# Patient Record
Sex: Female | Born: 2000 | Race: Black or African American | Hispanic: No | Marital: Single | Smoking: Never smoker
Health system: Southern US, Community
[De-identification: ages and names within clinical notes are randomized; demographics above are authoritative.]

## PROBLEM LIST (undated history)

## (undated) DIAGNOSIS — D649 Anemia, unspecified: Secondary | ICD-10-CM

## (undated) DIAGNOSIS — J302 Other seasonal allergic rhinitis: Secondary | ICD-10-CM

## (undated) HISTORY — PX: UMBILICAL HERNIA REPAIR: SHX196

## (undated) HISTORY — DX: Anemia, unspecified: D64.9

---
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
## 2022-04-01 ENCOUNTER — Ambulatory Visit: Payer: Medicaid Other | Attending: Nurse Practitioner | Admitting: Nurse Practitioner

## 2022-04-01 ENCOUNTER — Encounter: Payer: Self-pay | Admitting: Nurse Practitioner

## 2022-04-01 VITALS — BP 132/82 | HR 79 | Temp 98.0°F | Ht 67.8 in | Wt 167.0 lb

## 2022-04-01 DIAGNOSIS — N6311 Unspecified lump in the right breast, upper outer quadrant: Secondary | ICD-10-CM | POA: Diagnosis not present

## 2022-04-01 NOTE — Progress Notes (Signed)
Assessment & Plan:  Gabrielle King was seen today for breast problem.  Diagnoses and all orders for this visit:  Breast lump on right side at 11 o'clock position Follow up in 2-3 weeks after menstrual cycle for repeat exam    Patient has been counseled on age-appropriate routine health concerns for screening and prevention. These are reviewed and up-to-date. Referrals have been placed accordingly. Immunizations are up-to-date or declined.    Subjective:   Chief Complaint  Patient presents with   Breast Problem    Right breast lump    HPI Gabrielle King 21 y.o. female presents to office today right breast lump which she noticed today. Her menstrual cycle is due at the end of the week. She was very concerned as she has a history of breast cancer in maternal grandmother and maternal great aunt.    Review of Systems  Constitutional:  Negative for fever, malaise/fatigue and weight loss.  HENT: Negative.  Negative for nosebleeds.   Eyes: Negative.  Negative for blurred vision, double vision and photophobia.  Respiratory: Negative.  Negative for cough and shortness of breath.   Cardiovascular: Negative.  Negative for chest pain, palpitations and leg swelling.  Gastrointestinal: Negative.  Negative for heartburn, nausea and vomiting.  Musculoskeletal: Negative.  Negative for myalgias.  Skin:        SEE HPI  Neurological: Negative.  Negative for dizziness, focal weakness, seizures and headaches.  Psychiatric/Behavioral: Negative.  Negative for suicidal ideas.     Past Medical History:  Diagnosis Date   Anemia    Seasonal allergies     Past Surgical History:  Procedure Laterality Date   UMBILICAL HERNIA REPAIR      Family History  Problem Relation Age of Onset   Hypertension Mother    Hypothyroidism Mother     Social History Reviewed with no changes to be made today.   Outpatient Medications Prior to Visit  Medication Sig Dispense Refill   ferrous sulfate 325 (65 FE) MG  EC tablet Take 1 tablet (325 mg total) by mouth daily with breakfast. (Patient taking differently: Take 100 mg by mouth daily with breakfast.) 90 tablet 1   Olopatadine HCl 0.2 % SOLN Apply 1 drop to eye daily as needed. 2.5 mL 3   No facility-administered medications prior to visit.    Allergies  Allergen Reactions   Shellfish Allergy Itching       Objective:    BP 132/82   Pulse 79   Temp 98 F (36.7 C) (Temporal)   Ht 5' 7.8" (1.722 m)   Wt 167 lb (75.8 kg)   LMP 03/10/2022 (Approximate)   SpO2 98%   BMI 25.54 kg/m  Wt Readings from Last 3 Encounters:  04/01/22 167 lb (75.8 kg)  12/01/19 153 lb (69.4 kg) (84 %, Z= 1.00)*  07/26/16 156 lb 11.2 oz (71.1 kg) (91 %, Z= 1.35)*   * Growth percentiles are based on CDC (Girls, 2-20 Years) data.    Physical Exam Vitals and nursing note reviewed.  Constitutional:      Appearance: She is well-developed.  HENT:     Head: Normocephalic and atraumatic.  Cardiovascular:     Rate and Rhythm: Normal rate and regular rhythm.     Heart sounds: Normal heart sounds. No murmur heard.    No friction rub. No gallop.  Pulmonary:     Effort: Pulmonary effort is normal. No tachypnea or respiratory distress.     Breath sounds: Normal breath sounds. No decreased  breath sounds, wheezing, rhonchi or rales.  Chest:     Chest wall: No tenderness.     Comments: She has several areas on both breasts with numerous cysts and lumps that are non tender Abdominal:     General: Bowel sounds are normal.     Palpations: Abdomen is soft.  Musculoskeletal:        General: Normal range of motion.     Cervical back: Normal range of motion.  Skin:    General: Skin is warm and dry.  Neurological:     Mental Status: She is alert and oriented to person, place, and time.     Coordination: Coordination normal.  Psychiatric:        Behavior: Behavior normal. Behavior is cooperative.        Thought Content: Thought content normal.        Judgment: Judgment  normal.          Patient has been counseled extensively about nutrition and exercise as well as the importance of adherence with medications and regular follow-up. The patient was given clear instructions to go to ER or return to medical center if symptoms don't improve, worsen or new problems develop. The patient verbalized understanding.   Follow-up: Return in 23 days (on 04/24/2022) for double book 1110.   Claiborne Rigg, FNP-BC [facility] and [facility] [place], [place] [phone]   04/01/2022, 3:04 PM

## 2022-04-24 ENCOUNTER — Ambulatory Visit: Payer: Medicaid Other | Attending: Nurse Practitioner | Admitting: Nurse Practitioner

## 2022-04-24 ENCOUNTER — Encounter: Payer: Self-pay | Admitting: Nurse Practitioner

## 2022-04-24 ENCOUNTER — Other Ambulatory Visit: Payer: Self-pay | Admitting: Nurse Practitioner

## 2022-04-24 VITALS — BP 126/82 | HR 97 | Temp 98.0°F | Ht 67.8 in | Wt 150.0 lb

## 2022-04-24 DIAGNOSIS — N6459 Other signs and symptoms in breast: Secondary | ICD-10-CM

## 2022-04-24 NOTE — Progress Notes (Signed)
Assessment & Plan:  Kristyne was seen today for breast mass.  Diagnoses and all orders for this visit:  Abnormal breast exam -     US BREAST LTD UNI RIGHT INC AXILLA; Future -     US BREAST LTD UNI LEFT INC AXILLA; Future    Patient has been counseled on age-appropriate routine health concerns for screening and prevention. These are reviewed and up-to-date. Referrals have been placed accordingly. Immunizations are up-to-date or declined.    Subjective:   Chief Complaint  Patient presents with   Breast Mass   HPI Gabrielle King 21 y.o. female presents to office today for repeat breast exam. She was seen in my office a few weeks ago for breast lump in the right breast however she was also very close to having her menstrual cycle so I had her return a few weeks later to reexamine her breasts. She does have a family history of breast ca in maternal grandmother and maternal great aunt.    Physical exam today is reveals likely fibrocystic breasts however there are 2 larger breast lumps in the left and right breast that will require correlation via imaging.    Review of Systems  Constitutional:  Negative for fever, malaise/fatigue and weight loss.  HENT: Negative.  Negative for nosebleeds.   Eyes: Negative.  Negative for blurred vision, double vision and photophobia.  Respiratory: Negative.  Negative for cough and shortness of breath.   Cardiovascular: Negative.  Negative for chest pain, palpitations and leg swelling.  Gastrointestinal: Negative.  Negative for heartburn, nausea and vomiting.  Musculoskeletal:  Negative for myalgias.       SEE HPI  Neurological: Negative.  Negative for dizziness, focal weakness, seizures and headaches.  Psychiatric/Behavioral: Negative.  Negative for suicidal ideas.     Past Medical History:  Diagnosis Date   Anemia    Seasonal allergies     Past Surgical History:  Procedure Laterality Date   UMBILICAL HERNIA REPAIR      Family History   Problem Relation Age of Onset   Hypertension Mother    Hypothyroidism Mother     Social History Reviewed with no changes to be made today.   Outpatient Medications Prior to Visit  Medication Sig Dispense Refill   ferrous sulfate 325 (65 FE) MG EC tablet Take 1 tablet (325 mg total) by mouth daily with breakfast. (Patient not taking: Reported on 04/24/2022) 90 tablet 1   Olopatadine HCl 0.2 % SOLN Apply 1 drop to eye daily as needed. (Patient not taking: Reported on 04/24/2022) 2.5 mL 3   No facility-administered medications prior to visit.    Allergies  Allergen Reactions   Shellfish Allergy Itching       Objective:    BP 126/82   Pulse 97   Temp 98 F (36.7 C) (Temporal)   Ht 5' 7.8" (1.722 m)   Wt 150 lb (68 kg)   LMP 04/10/2022 (Approximate)   SpO2 100%   BMI 22.94 kg/m  Wt Readings from Last 3 Encounters:  04/24/22 150 lb (68 kg)  04/01/22 167 lb (75.8 kg)  12/01/19 153 lb (69.4 kg) (84 %, Z= 1.00)*   * Growth percentiles are based on CDC (Girls, 2-20 Years) data.    Physical Exam Vitals and nursing note reviewed.  Constitutional:      Appearance: She is well-developed.  HENT:     Head: Normocephalic and atraumatic.  Cardiovascular:     Rate and Rhythm: Normal rate and regular rhythm.  Heart sounds: Normal heart sounds. No murmur heard.    No friction rub. No gallop.  Pulmonary:     Effort: Pulmonary effort is normal. No tachypnea or respiratory distress.     Breath sounds: Normal breath sounds. No decreased breath sounds, wheezing, rhonchi or rales.  Chest:     Chest wall: No mass or tenderness.  Breasts:    Right: Mass present. No swelling, bleeding, inverted nipple, nipple discharge, skin change or tenderness.     Left: Mass present. No swelling, bleeding, inverted nipple, nipple discharge, skin change or tenderness.    Abdominal:     General: Bowel sounds are normal.     Palpations: Abdomen is soft.  Musculoskeletal:        General: Normal  range of motion.     Cervical back: Normal range of motion.  Skin:    General: Skin is warm and dry.  Neurological:     Mental Status: She is alert and oriented to person, place, and time.     Coordination: Coordination normal.  Psychiatric:        Behavior: Behavior normal. Behavior is cooperative.        Thought Content: Thought content normal.        Judgment: Judgment normal.          Patient has been counseled extensively about nutrition and exercise as well as the importance of adherence with medications and regular follow-up. The patient was given clear instructions to go to ER or return to medical center if symptoms don't improve, worsen or new problems develop. The patient verbalized understanding.   Follow-up: Return in about 4 months (around 08/24/2022) for physical.   Claiborne Rigg, FNP-BC [facility] and [facility] [place], [place] [phone]   04/24/2022, 2:43 PM

## 2022-05-13 ENCOUNTER — Other Ambulatory Visit: Payer: Medicaid Other

## 2022-06-22 ENCOUNTER — Ambulatory Visit
Admission: RE | Admit: 2022-06-22 | Discharge: 2022-06-22 | Disposition: A | Discharge status: Home / Self Care | Payer: 59 | Source: Ambulatory Visit | Attending: Nurse Practitioner | Admitting: Nurse Practitioner

## 2022-06-22 ENCOUNTER — Ambulatory Visit
Admission: RE | Admit: 2022-06-22 | Discharge: 2022-06-22 | Disposition: A | Discharge status: Home / Self Care | Payer: Medicaid Other | Source: Ambulatory Visit | Attending: Nurse Practitioner | Admitting: Nurse Practitioner

## 2022-06-22 DIAGNOSIS — N6489 Other specified disorders of breast: Secondary | ICD-10-CM | POA: Diagnosis not present

## 2022-06-22 DIAGNOSIS — N6459 Other signs and symptoms in breast: Secondary | ICD-10-CM

## 2022-08-24 ENCOUNTER — Encounter: Payer: Self-pay | Admitting: Nurse Practitioner

## 2022-08-24 ENCOUNTER — Ambulatory Visit: Payer: 59 | Attending: Nurse Practitioner | Admitting: Nurse Practitioner

## 2022-08-24 VITALS — BP 133/86 | Ht 67.8 in | Wt 148.2 lb

## 2022-08-24 DIAGNOSIS — D72819 Decreased white blood cell count, unspecified: Secondary | ICD-10-CM

## 2022-08-24 DIAGNOSIS — Z862 Personal history of diseases of the blood and blood-forming organs and certain disorders involving the immune mechanism: Secondary | ICD-10-CM | POA: Diagnosis not present

## 2022-08-24 DIAGNOSIS — Z0000 Encounter for general adult medical examination without abnormal findings: Secondary | ICD-10-CM

## 2022-08-24 DIAGNOSIS — Z23 Encounter for immunization: Secondary | ICD-10-CM

## 2022-08-24 NOTE — Progress Notes (Signed)
Assessment & Plan:  Gabrielle King was seen today for annual exam.  Diagnoses and all orders for this visit:  Encounter for annual physical exam -     Basic metabolic panel  Need for HPV vaccination -     HPV 9-valent vaccine,Recombinat  Leukopenia, unspecified type -     CBC with Differential  History of anemia -     CBC with Differential -     Basic metabolic panel    Patient has been counseled on age-appropriate routine health concerns for screening and prevention. These are reviewed and up-to-date. Referrals have been placed accordingly. Immunizations are up-to-date or declined.    Subjective:   Chief Complaint  Patient presents with   Annual Exam   HPI Gabrielle King 22 y.o. female presents to office today for annual physical exam.  She received her second dose of HPV vaccine today. She is not currently sexually active nor has she ever been.  Review of Systems  Constitutional:  Negative for fever, malaise/fatigue and weight loss.  HENT: Negative.  Negative for nosebleeds.   Eyes: Negative.  Negative for blurred vision, double vision and photophobia.  Respiratory: Negative.  Negative for cough and shortness of breath.   Cardiovascular: Negative.  Negative for chest pain, palpitations and leg swelling.  Gastrointestinal: Negative.  Negative for heartburn, nausea and vomiting.  Genitourinary: Negative.   Musculoskeletal: Negative.  Negative for myalgias.  Skin: Negative.   Neurological: Negative.  Negative for dizziness, focal weakness, seizures and headaches.  Endo/Heme/Allergies: Negative.   Psychiatric/Behavioral: Negative.  Negative for suicidal ideas.     Past Medical History:  Diagnosis Date   Anemia    Seasonal allergies     Past Surgical History:  Procedure Laterality Date   UMBILICAL HERNIA REPAIR      Family History  Problem Relation Age of Onset   Hypertension Mother    Hypothyroidism Mother     Social History Reviewed with no changes to be  made today.   No outpatient medications prior to visit.   No facility-administered medications prior to visit.    Allergies  Allergen Reactions   Shellfish Allergy Itching       Objective:    BP 133/86   Ht 5' 7.8" (1.722 m)   Wt 148 lb 3.2 oz (67.2 kg)   LMP 08/10/2022   SpO2 98%   BMI 22.67 kg/m  Wt Readings from Last 3 Encounters:  08/24/22 148 lb 3.2 oz (67.2 kg)  04/24/22 150 lb (68 kg)  04/01/22 167 lb (75.8 kg)    Physical Exam Constitutional:      Appearance: She is well-developed.  HENT:     Head: Normocephalic and atraumatic.     Right Ear: Hearing, tympanic membrane, ear canal and external ear normal.     Left Ear: Hearing, tympanic membrane, ear canal and external ear normal.     Nose: Nose normal.     Right Turbinates: Not enlarged.     Left Turbinates: Not enlarged.     Mouth/Throat:     Lips: Pink.     Mouth: Mucous membranes are moist.     Dentition: No dental tenderness, gingival swelling, dental abscesses or gum lesions.     Pharynx: No oropharyngeal exudate.  Eyes:     General: No scleral icterus.       Right eye: No discharge.     Extraocular Movements: Extraocular movements intact.     Conjunctiva/sclera: Conjunctivae normal.     Pupils: Pupils  are equal, round, and reactive to light.  Neck:     Thyroid: No thyromegaly.     Trachea: No tracheal deviation.  Cardiovascular:     Rate and Rhythm: Normal rate and regular rhythm.     Heart sounds: Normal heart sounds. No murmur heard.    No friction rub.  Pulmonary:     Effort: Pulmonary effort is normal. No accessory muscle usage or respiratory distress.     Breath sounds: Normal breath sounds. No decreased breath sounds, wheezing, rhonchi or rales.  Abdominal:     General: Bowel sounds are normal. There is no distension.     Palpations: Abdomen is soft. There is no mass.     Tenderness: There is no abdominal tenderness. There is no right CVA tenderness, left CVA tenderness, guarding or  rebound.     Hernia: No hernia is present.  Musculoskeletal:        General: No tenderness or deformity. Normal range of motion.     Cervical back: Normal range of motion and neck supple.  Lymphadenopathy:     Cervical: No cervical adenopathy.  Skin:    General: Skin is warm and dry.     Findings: No erythema.  Neurological:     Mental Status: She is alert and oriented to person, place, and time.     Cranial Nerves: No cranial nerve deficit.     Motor: Motor function is intact.     Coordination: Coordination is intact. Coordination normal.     Gait: Gait is intact.     Deep Tendon Reflexes:     Reflex Scores:      Patellar reflexes are 1+ on the right side and 1+ on the left side. Psychiatric:        Attention and Perception: Attention normal.        Mood and Affect: Mood normal.        Speech: Speech normal.        Behavior: Behavior normal.        Thought Content: Thought content normal.        Judgment: Judgment normal.          Patient has been counseled extensively about nutrition and exercise as well as the importance of adherence with medications and regular follow-up. The patient was given clear instructions to go to ER or return to medical center if symptoms don't improve, worsen or new problems develop. The patient verbalized understanding.   Follow-up: Return if symptoms worsen or fail to improve.   Gildardo Pounds, FNP-BC [facility] and [place], [place]   08/24/2022, 12:01 PM

## 2022-08-25 LAB — CBC WITH DIFFERENTIAL/PLATELET
Basophils Absolute: 0 10*3/uL (ref 0.0–0.2)
Basos: 1 %
EOS (ABSOLUTE): 0.1 10*3/uL (ref 0.0–0.4)
Eos: 3 %
Hematocrit: 36.2 % (ref 34.0–46.6)
Hemoglobin: 11.4 g/dL (ref 11.1–15.9)
Immature Grans (Abs): 0 10*3/uL (ref 0.0–0.1)
Immature Granulocytes: 0 %
Lymphocytes Absolute: 1.6 10*3/uL (ref 0.7–3.1)
Lymphs: 53 %
MCH: 26.9 pg (ref 26.6–33.0)
MCHC: 31.5 g/dL (ref 31.5–35.7)
MCV: 85 fL (ref 79–97)
Monocytes Absolute: 0.2 10*3/uL (ref 0.1–0.9)
Monocytes: 6 %
Neutrophils Absolute: 1.1 10*3/uL — ABNORMAL LOW (ref 1.4–7.0)
Neutrophils: 37 %
Platelets: 202 10*3/uL (ref 150–450)
RBC: 4.24 x10E6/uL (ref 3.77–5.28)
RDW: 13.4 % (ref 11.7–15.4)
WBC: 3 10*3/uL — ABNORMAL LOW (ref 3.4–10.8)

## 2022-08-25 LAB — BASIC METABOLIC PANEL
BUN/Creatinine Ratio: 15 (ref 9–23)
BUN: 12 mg/dL (ref 6–20)
CO2: 20 mmol/L (ref 20–29)
Calcium: 9.7 mg/dL (ref 8.7–10.2)
Chloride: 106 mmol/L (ref 96–106)
Creatinine, Ser: 0.8 mg/dL (ref 0.57–1.00)
Glucose: 105 mg/dL — ABNORMAL HIGH (ref 70–99)
Potassium: 4.1 mmol/L (ref 3.5–5.2)
Sodium: 141 mmol/L (ref 134–144)
eGFR: 107 mL/min/{1.73_m2} (ref >59)

## 2022-08-28 ENCOUNTER — Encounter: Payer: Medicaid Other | Admitting: Nurse Practitioner

## 2022-10-08 ENCOUNTER — Encounter: Payer: Self-pay | Admitting: Nurse Practitioner

## 2022-12-25 DIAGNOSIS — L309 Dermatitis, unspecified: Secondary | ICD-10-CM | POA: Diagnosis not present

## 2023-05-18 ENCOUNTER — Encounter (HOSPITAL_BASED_OUTPATIENT_CLINIC_OR_DEPARTMENT_OTHER): Payer: Self-pay | Admitting: Emergency Medicine

## 2023-05-18 ENCOUNTER — Other Ambulatory Visit: Payer: Self-pay

## 2023-05-18 ENCOUNTER — Emergency Department (HOSPITAL_BASED_OUTPATIENT_CLINIC_OR_DEPARTMENT_OTHER)
Admission: EM | Admit: 2023-05-18 | Discharge: 2023-05-18 | Disposition: A | Discharge status: Home / Self Care | Payer: 59 | Attending: Emergency Medicine | Admitting: Emergency Medicine

## 2023-05-18 DIAGNOSIS — J020 Streptococcal pharyngitis: Secondary | ICD-10-CM | POA: Insufficient documentation

## 2023-05-18 DIAGNOSIS — Z20822 Contact with and (suspected) exposure to covid-19: Secondary | ICD-10-CM | POA: Diagnosis not present

## 2023-05-18 DIAGNOSIS — J029 Acute pharyngitis, unspecified: Secondary | ICD-10-CM | POA: Diagnosis present

## 2023-05-18 LAB — RESP PANEL BY RT-PCR (RSV, FLU A&B, COVID)  RVPGX2
Influenza A by PCR: NEGATIVE
Influenza B by PCR: NEGATIVE
Resp Syncytial Virus by PCR: NEGATIVE
SARS Coronavirus 2 by RT PCR: NEGATIVE

## 2023-05-18 LAB — GROUP A STREP BY PCR: Group A Strep by PCR: DETECTED — AB

## 2023-05-18 MED ORDER — PENICILLIN G BENZATHINE 1200000 UNIT/2ML IM SUSY
1.2000 10*6.[IU] | PREFILLED_SYRINGE | Freq: Once | INTRAMUSCULAR | Status: AC
  Administered 2023-05-18: 1.2 10*6.[IU] via INTRAMUSCULAR
  Filled 2023-05-18: qty 2

## 2023-05-18 NOTE — ED Notes (Signed)
Pt given discharge instructions. Opportunities given for questions. Pt verbalizes understanding. Stone,Heather R, RN 

## 2023-05-18 NOTE — Discharge Instructions (Addendum)
As we discussed, you tested positive for strep throat today.  At this is a bacterial infection, we have treated you with a single dose of a shot of antibiotics.  You should start to feel better in the next day or so.  In the interim, I recommend that you get plenty of rest, take Tylenol/Motrin as needed for pain and fevers and ensure you are maintaining adequate oral hydration specifically with fluids with electrolytes such as Gatorade or Pedialyte.  Follow-up with your primary doctor as needed.  Return if development of any new or worsening symptoms.

## 2023-05-18 NOTE — ED Provider Notes (Signed)
[place] EMERGENCY DEPARTMENT AT [facility] Provider Note   CSN: [id number] Arrival date & time: 05/18/23  6387     History  Chief Complaint  Patient presents with   Sore Throat    Gabrielle King is a 22 y.o. female.  Patient with noncontributory past medical history presents today with complaints of sore throat. She states that same began yesterday and has been persistent since. She is able to swallow with some discomfort. Denies fevers or chills. No known sick contacts. No headaches, neck stiffness, vision changes, chest pain, of shortness of breath.  The history is provided by the patient. No language interpreter was used.  Sore Throat       Home Medications Prior to Admission medications   Not on File      Allergies    Shellfish allergy    Review of Systems   Review of Systems  HENT:  Positive for sore throat.   All other systems reviewed and are negative.   Physical Exam Updated Vital Signs BP 106/66   Pulse 85   Temp 99.2 F (37.3 C) (Oral)   Resp 17   Ht 5\' 6"  (1.676 m)   Wt 65.8 kg   LMP 05/09/2023 (Exact Date)   SpO2 100%   BMI 23.40 kg/m  Physical Exam Vitals and nursing note reviewed.  Constitutional:      General: She is not in acute distress.    Appearance: Normal appearance. She is normal weight. She is not ill-appearing, toxic-appearing or diaphoretic.  HENT:     Head: Normocephalic and atraumatic.     Mouth/Throat:     Pharynx: Oropharynx is clear. Uvula midline.     Tonsils: Tonsillar exudate present. No tonsillar abscesses. 2+ on the right. 2+ on the left.     Comments: Uvula midline, bilateral tonsillar swelling and exudate.  No swelling under the tongue or signs of Ludwig's angina.  No facial swelling.  No signs of RPA or PTA.  Patient tolerating secretions. Neck:     Comments: No meningismus Cardiovascular:     Rate and Rhythm: Normal rate and regular rhythm.     Heart sounds: Normal heart sounds.  Pulmonary:      Effort: Pulmonary effort is normal. No respiratory distress.     Breath sounds: Normal breath sounds.  Abdominal:     Palpations: Abdomen is soft.     Tenderness: There is no abdominal tenderness.  Musculoskeletal:        General: Normal range of motion.     Cervical back: Normal range of motion and neck supple.  Lymphadenopathy:     Cervical: Cervical adenopathy present.  Skin:    General: Skin is warm and dry.  Neurological:     General: No focal deficit present.     Mental Status: She is alert and oriented to person, place, and time.  Psychiatric:        Mood and Affect: Mood normal.        Behavior: Behavior normal.     ED Results / Procedures / Treatments   Labs (all labs ordered are listed, but only abnormal results are displayed) Labs Reviewed  GROUP A STREP BY PCR - Abnormal; Notable for the following components:      Result Value   Group A Strep by PCR DETECTED (*)    All other components within normal limits  RESP PANEL BY RT-PCR (RSV, FLU A&B, COVID)  [id number]    EKG None  Radiology No results  found.  Procedures Procedures    Medications Ordered in ED Medications  penicillin g benzathine (BICILLIN LA) 1200000 UNIT/2ML injection 1.2 Million Units (has no administration in time range)    ED Course/ Medical Decision Making/ A&P                                 Medical Decision Making Risk Prescription drug management.   Patient presents today with complaints of sore throat x 1 day.  She is afebrile, nontoxic-appearing, and in no acute distress reassuring vital signs.  Physical exam reveals tonsillar exudate, cervical lymphadenopathy, & dysphagia; diagnosis of bacterial pharyngitis.  She is positive for strep.  Negative for COVID, flu, and RSV.  Given IM penicillin in the department today. Discussed importance of water rehydration. Presentation non concerning for PTA or RPA. No trismus or uvula deviation. Pt able to drink water in ED without difficulty with  intact air way. Evaluation and diagnostic testing in the emergency department does not suggest an emergent condition requiring admission or immediate intervention beyond what has been performed at this time.  Plan for discharge with close PCP follow-up.  Patient is understanding and amenable with plan, educated on red flag symptoms that would prompt immediate return.  Patient discharged in stable condition.  Final Clinical Impression(s) / ED Diagnoses Final diagnoses:  Strep pharyngitis    Rx / DC Orders ED Discharge Orders     None     An After Visit Summary was printed and given to the patient.     Vear Clock 05/18/23 1103    Ernie Avena, MD 05/19/23 1024

## 2023-05-18 NOTE — ED Triage Notes (Signed)
Pt caox4, ambulatory, NAD c/o sore throat, congestion, bodyaches, and headache since yesterday. Pt states primary complaint is the pain in the throat.
